# Patient Record
Sex: Female | Born: 1960 | Race: White | Hispanic: No | Marital: Single | Smoking: Current every day smoker
Health system: Southern US, Community
[De-identification: ages and names within clinical notes are randomized; demographics above are authoritative.]

## PROBLEM LIST (undated history)

## (undated) DIAGNOSIS — R569 Unspecified convulsions: Secondary | ICD-10-CM

## (undated) DIAGNOSIS — I10 Essential (primary) hypertension: Secondary | ICD-10-CM

---

## 2020-07-15 ENCOUNTER — Emergency Department (INDEPENDENT_AMBULATORY_CARE_PROVIDER_SITE_OTHER): Payer: BC Managed Care – PPO

## 2020-07-15 ENCOUNTER — Emergency Department
Admission: EM | Admit: 2020-07-15 | Discharge: 2020-07-15 | Disposition: A | Discharge status: Home / Self Care | Payer: Self-pay | Source: Home / Self Care

## 2020-07-15 ENCOUNTER — Other Ambulatory Visit: Payer: Self-pay

## 2020-07-15 DIAGNOSIS — M25551 Pain in right hip: Secondary | ICD-10-CM

## 2020-07-15 DIAGNOSIS — M5134 Other intervertebral disc degeneration, thoracic region: Secondary | ICD-10-CM | POA: Diagnosis not present

## 2020-07-15 DIAGNOSIS — M5136 Other intervertebral disc degeneration, lumbar region: Secondary | ICD-10-CM

## 2020-07-15 DIAGNOSIS — M546 Pain in thoracic spine: Secondary | ICD-10-CM

## 2020-07-15 DIAGNOSIS — M8588 Other specified disorders of bone density and structure, other site: Secondary | ICD-10-CM | POA: Diagnosis not present

## 2020-07-15 DIAGNOSIS — M545 Low back pain, unspecified: Secondary | ICD-10-CM

## 2020-07-15 HISTORY — DX: Essential (primary) hypertension: I10

## 2020-07-15 HISTORY — DX: Unspecified convulsions: R56.9

## 2020-07-15 MED ORDER — IBUPROFEN 600 MG PO TABS
600.0000 mg | ORAL_TABLET | Freq: Once | ORAL | Status: AC
  Administered 2020-07-15: 600 mg via ORAL

## 2020-07-15 MED ORDER — METHYLPREDNISOLONE ACETATE 80 MG/ML IJ SUSP
80.0000 mg | Freq: Once | INTRAMUSCULAR | Status: AC
  Administered 2020-07-15: 80 mg via INTRAMUSCULAR

## 2020-07-15 MED ORDER — METHOCARBAMOL 500 MG PO TABS: 500.0000 mg | ORAL_TABLET | Freq: Two times a day (BID) | ORAL | 0 refills | Status: AC

## 2020-07-15 NOTE — ED Triage Notes (Addendum)
Patient presents to Urgent Care with complaints of frequent falls and back pain since two days ago after tripping over a bucket and falling, landing on her back and left side of her neck. Pt denies LOC. Larey Seat again last night while trying to hang curtains, again landing on her back. Patient reports she has peripheral neuropathy, has an appt w/ the neurologist in 2 weeks. Ambulatory w/ steady gait upon arrival.

## 2020-07-15 NOTE — ED Provider Notes (Signed)
[facility]    CSN: [id number] Arrival date & time: 07/15/20  1336      History   Chief Complaint Chief Complaint  Patient presents with  . Fall  . Back Pain    HPI Anna Best is a 59 y.o. female.   HPI Anna Best is a 59 y.o. female presenting to UC with c/o Left lower back pain that started 2 days ago after tripping over a bucket and landing on her buttock.  She fell again last night standing on a wood stool trying to hand curtains, pt fell and landed directly on her back on the floor. Pain is mid to lower back, bilateral, worse in Right lower, radiating in to Right hip.  Hx of peripheral neuropathy as well as DDD in her neck and a bulging disc in her spine.  Denies hx of osteopenia or osteoporosis.  She usually sees her PCP for neck and back pain but also has a neurologist, who she is scheduled to f/u with in 2 weeks.  Denies HA or dizziness. Denies weakness or new numbness in legs. No change in bowel or bladder function.  She took [facility] powder PTA without relief.    Past Medical History:  Diagnosis Date  . Hypertension   . Seizures (HCC)     There are no problems to display for this patient.   History reviewed. No pertinent surgical history.  OB History   No obstetric history on file.      Home Medications    Prior to Admission medications   Medication Sig Start Date End Date Taking? Authorizing Provider  atorvastatin (LIPITOR) 40 MG tablet Take 40 mg by mouth daily.   Yes [provider]  clonazePAM (KLONOPIN) 1 MG tablet Take 1 mg by mouth 2 (two) times daily.   Yes [provider]  escitalopram (LEXAPRO) 20 MG tablet Take 20 mg by mouth daily.   Yes [provider]  hydrochlorothiazide (HYDRODIURIL) 25 MG tablet Take 25 mg by mouth daily.   Yes [provider]  lamoTRIgine (LAMICTAL) 100 MG tablet Take 100 mg by mouth daily. Four times perday   Yes [provider]  lisinopril (ZESTRIL) 10 MG tablet Take  10 mg by mouth daily.   Yes [provider]  zolpidem (AMBIEN) 5 MG tablet Take 5 mg by mouth at bedtime as needed for sleep.   Yes [provider]  methocarbamol (ROBAXIN) 500 MG tablet Take 1 tablet (500 mg total) by mouth 2 (two) times daily. 07/15/20   Lurene Shadow, PA-C    Family History Family History  Problem Relation Age of Onset  . Hypertension Mother   . Hypertension Father   . Kidney disease Father     Social History Social History   Tobacco Use  . Smoking status: Current Every Day Smoker    Packs/day: 1.00    Types: Cigarettes  . Smokeless tobacco: Never Used  Vaping Use  . Vaping Use: Never used  Substance Use Topics  . Alcohol use: Not Currently  . Drug use: Not on file     Allergies   Codeine   Review of Systems Review of Systems  Musculoskeletal: Positive for arthralgias, back pain, myalgias and neck pain. Negative for neck stiffness.  Skin: Negative for color change and wound.     Physical Exam Triage Vital Signs ED Triage Vitals  Enc Vitals Group     BP 07/15/20 1404 121/78     Pulse Rate 07/15/20  1404 65     Resp 07/15/20 1404 18     Temp 07/15/20 1404 97.8 F (36.6 C)     Temp Source 07/15/20 1404 Oral     SpO2 07/15/20 1404 97 %     Weight --      Height --      Head Circumference --      Peak Flow --      Pain Score 07/15/20 1358 9     Pain Loc --      Pain Edu? --      Excl. in GC? --    No data found.  Updated Vital Signs BP 121/78 (BP Location: Right Arm)   Pulse 65   Temp 97.8 F (36.6 C) (Oral)   Resp 18   SpO2 97%   Visual Acuity Right Eye Distance:   Left Eye Distance:   Bilateral Distance:    Right Eye Near:   Left Eye Near:    Bilateral Near:     Physical Exam Vitals and nursing note reviewed.  Constitutional:      Appearance: She is well-developed.     Comments: Appears uncomfortable, leaning on exam bed  HENT:     Head: Normocephalic and atraumatic.  Cardiovascular:     Rate and  Rhythm: Normal rate and regular rhythm.  Pulmonary:     Effort: Pulmonary effort is normal. No respiratory distress.     Breath sounds: Normal breath sounds.  Musculoskeletal:        General: Tenderness present. Normal range of motion.     Cervical back: Normal range of motion and neck supple. No tenderness. No spinous process tenderness or muscular tenderness.     Comments: Spinal and muscular tenderness from lower thoracic spine down to hips. Full ROM upper and lower extremities. No crepitus of hips, knees or ankles.  Antalgic gait.  Skin:    General: Skin is warm and dry.     Findings: No bruising or erythema.  Neurological:     Mental Status: She is alert and oriented to person, place, and time.  Psychiatric:        Behavior: Behavior normal.      UC Treatments / Results  Labs (all labs ordered are listed, but only abnormal results are displayed) Labs Reviewed - No data to display  EKG   Radiology DG Thoracic Spine 2 View  Result Date: 07/15/2020 CLINICAL DATA:  Thoracic and lumbar back pain post fall. Fall 2 days ago. EXAM: THORACIC SPINE 2 VIEWS COMPARISON:  Lumbar spine of the same date, reported separately FINDINGS: Osteopenia. Spinal degenerative changes. No sign of fracture or static malalignment. IMPRESSION: Osteopenia with signs of degenerative change. No acute fracture or static malalignment. Electronically Signed   By: Donzetta Kohut M.D.   On: 07/15/2020 15:22   DG Lumbar Spine Complete  Result Date: 07/15/2020 CLINICAL DATA:  Fall with back pain. EXAM: LUMBAR SPINE - COMPLETE 4+ VIEW COMPARISON:  Thoracic spine of the same date. FINDINGS: This and a static malalignment. Mild concavity of the L1 superior endplate. This is age indeterminate. Spinal degenerative changes greatest at L4-5 and L5-S1. Five lumbar type vertebral bodies. Signs of vascular disease with aortic atherosclerosis anterior to the spine. IMPRESSION: Multilevel spinal degenerative changes are worse  in the lower lumbar spine. Mild endplate concavity/anterior wedging suggested at L1, age indeterminate, may be chronic. Correlate with any pain in this area. Electronically Signed   By: Donzetta Kohut M.D.   On: 07/15/2020  15:25   DG Hip Unilat W or Wo Pelvis 2-3 Views Right  Result Date: 07/15/2020 CLINICAL DATA:  Right hip pain after fall yesterday. EXAM: DG HIP (WITH OR WITHOUT PELVIS) 2-3V RIGHT COMPARISON:  None. FINDINGS: There is no evidence of hip fracture or dislocation. There is no evidence of arthropathy or other focal bone abnormality. IMPRESSION: Negative. Electronically Signed   By: Lupita Raider M.D.   On: 07/15/2020 16:12    Procedures Procedures (including critical care time)  Medications Ordered in UC Medications  methylPREDNISolone acetate (DEPO-MEDROL) injection 80 mg (80 mg Intramuscular Given 07/15/20 1515)  ibuprofen (ADVIL) tablet 600 mg (600 mg Oral Given 07/15/20 1631)    Initial Impression / Assessment and Plan / UC Course  I have reviewed the triage vital signs and the nursing notes.  Pertinent labs & imaging results that were available during my care of the patient were reviewed by me and considered in my medical decision making (see chart for details).    Back pain from trip and falls. No red flag symptoms.  Depo-medrol 80mg  IM given in UC Pt denies pain relief, however, pt appears to be sitting more comfortably after medication given while waiting on imaging results. Initially just thoracic and lumbar ordered. discussed results Right hip ordered as that side is more bothersome for pt in addition to osteopenia noted today Reassured pt of normal Right hip Encouraged f/u with PCP and sports medicine for pain AVS given   Final Clinical Impressions(s) / UC Diagnoses   Final diagnoses:  Acute bilateral thoracic back pain  Acute bilateral low back pain without sciatica  Osteopenia of spine  Right hip pain     Discharge Instructions      Robaxin  (methocarbamol) is a muscle relaxer and may cause drowsiness. Do not drink alcohol, drive, or operate heavy machinery while taking.  Call to schedule a follow up appointment with your primary care provider this week or with sports medicine for recheck of symptoms and further evaluation of osteopenia noted on x-rays today.   Call 911 or have someone drive you to the hospital if symptoms significantly worsening including worsening pain, difficulty using the bathroom (unable to control your urine or stool), unable to walk, or other new concerning symptoms develop.      ED Prescriptions    Medication Sig Dispense Auth. Provider   methocarbamol (ROBAXIN) 500 MG tablet Take 1 tablet (500 mg total) by mouth 2 (two) times daily. 12 tablet , Lurene Shadow     PDMP not reviewed this encounter.   [place], Lurene Shadow 07/15/20 1718

## 2020-07-15 NOTE — Discharge Instructions (Signed)
  Robaxin (methocarbamol) is a muscle relaxer and may cause drowsiness. Do not drink alcohol, drive, or operate heavy machinery while taking.  Call to schedule a follow up appointment with your primary care provider this week or with sports medicine for recheck of symptoms and further evaluation of osteopenia noted on x-rays today.   Call 911 or have someone drive you to the hospital if symptoms significantly worsening including worsening pain, difficulty using the bathroom (unable to control your urine or stool), unable to walk, or other new concerning symptoms develop.

## 2022-05-07 IMAGING — DX DG THORACIC SPINE 2V
3 series · 3 of 3 positions shown · non-contrast
Comparison: Lumbar spine of the same date, reported separately

CLINICAL DATA: Thoracic and lumbar back pain post fall. Fall 2 days
ago.

EXAM:
THORACIC SPINE 2 VIEWS

[t-spine ap]
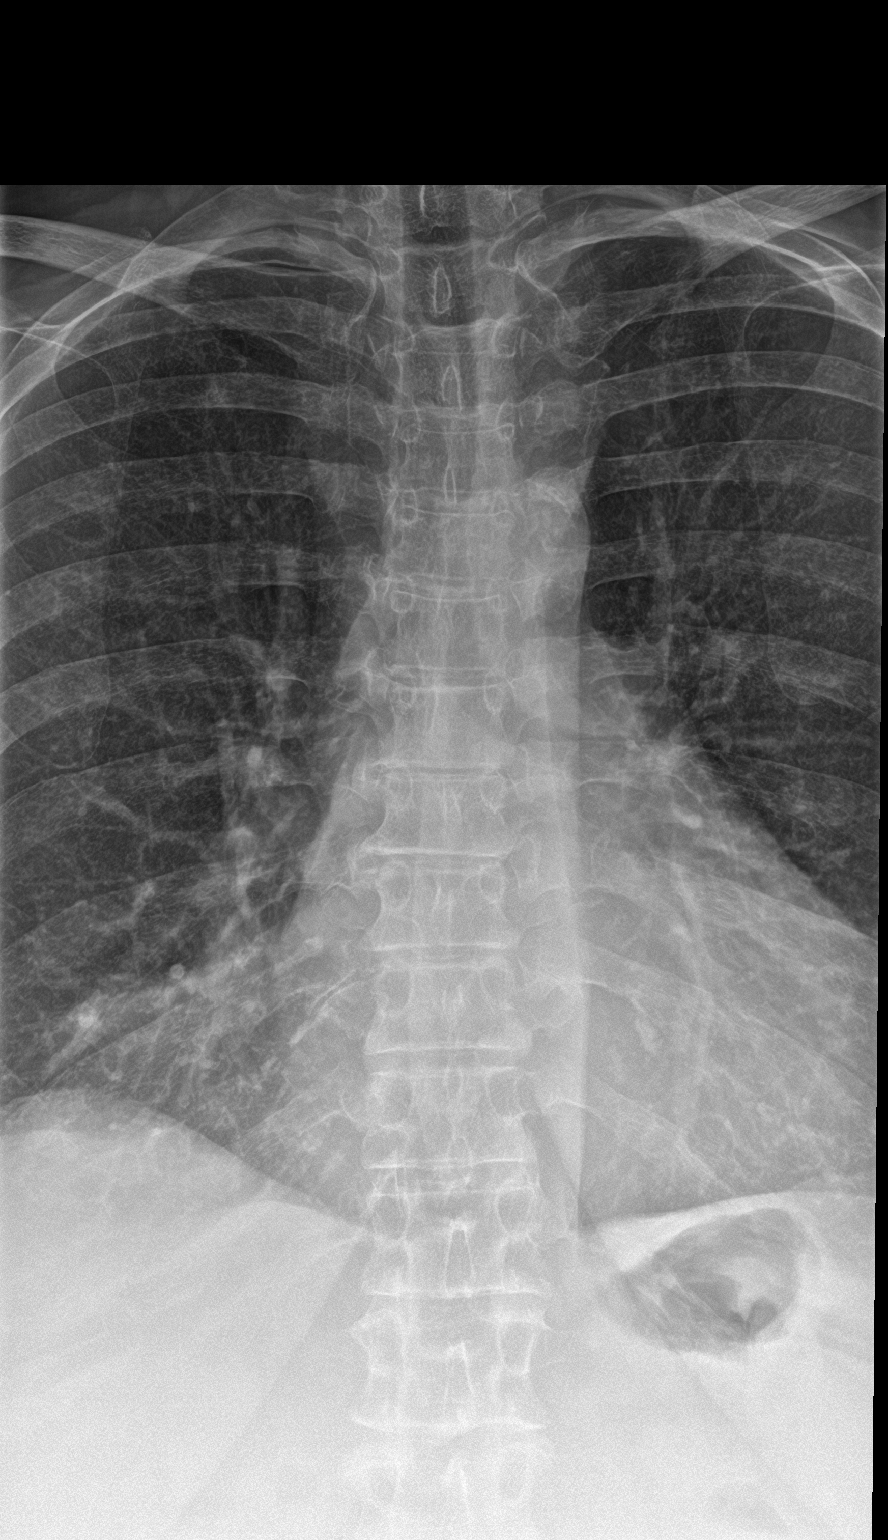

[t-spine lat]
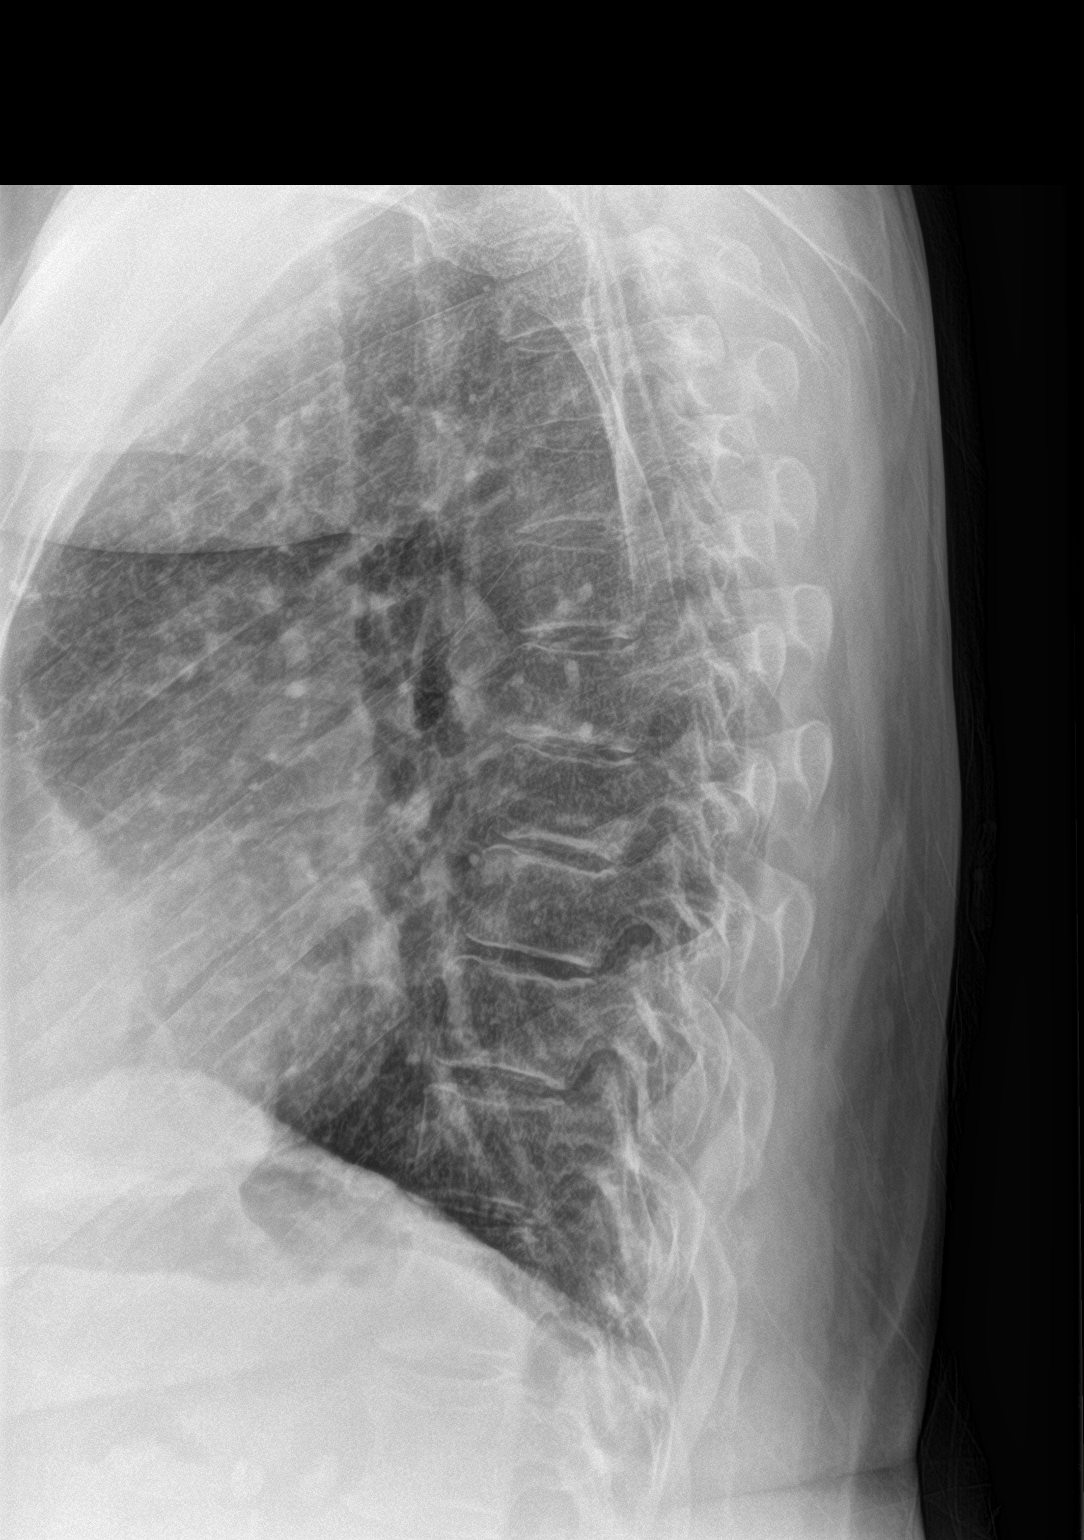

[t-spine swimmers]
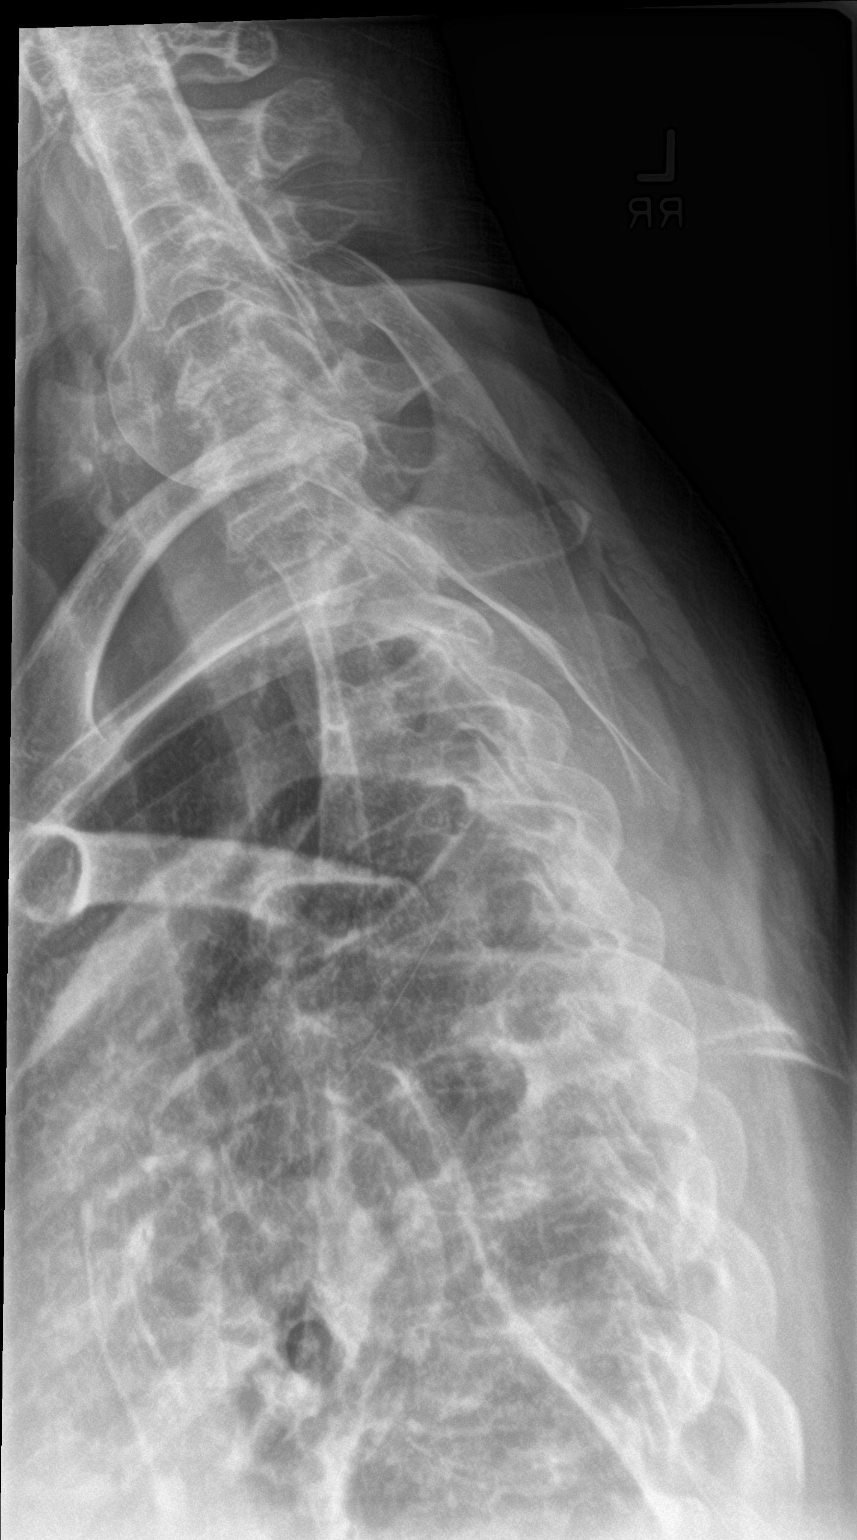

[3 of 3 positions shown; findings below may reference images not displayed]

FINDINGS: Osteopenia. Spinal degenerative changes. No sign of fracture or
static malalignment.
IMPRESSION: Osteopenia with signs of degenerative change. No acute fracture or
static malalignment.
